# Patient Record
Sex: Male | Born: 1990 | Race: Black or African American | Hispanic: No | Marital: Single | Smoking: Current every day smoker
Health system: Southern US, Community
[De-identification: ages and names within clinical notes are randomized; demographics above are authoritative.]

## PROBLEM LIST (undated history)

## (undated) DIAGNOSIS — C9210 Chronic myeloid leukemia, BCR/ABL-positive, not having achieved remission: Secondary | ICD-10-CM

## (undated) DIAGNOSIS — F419 Anxiety disorder, unspecified: Secondary | ICD-10-CM

---

## 2009-08-22 ENCOUNTER — Emergency Department: Payer: Self-pay | Admitting: Emergency Medicine

## 2020-05-08 ENCOUNTER — Emergency Department
Admission: EM | Admit: 2020-05-08 | Discharge: 2020-05-08 | Disposition: A | Discharge status: Home / Self Care | Payer: No Typology Code available for payment source | Attending: Emergency Medicine | Admitting: Emergency Medicine

## 2020-05-08 ENCOUNTER — Emergency Department: Payer: No Typology Code available for payment source

## 2020-05-08 ENCOUNTER — Other Ambulatory Visit: Payer: Self-pay

## 2020-05-08 DIAGNOSIS — R079 Chest pain, unspecified: Secondary | ICD-10-CM

## 2020-05-08 DIAGNOSIS — R0789 Other chest pain: Secondary | ICD-10-CM | POA: Diagnosis present

## 2020-05-08 DIAGNOSIS — F410 Panic disorder [episodic paroxysmal anxiety] without agoraphobia: Secondary | ICD-10-CM | POA: Diagnosis not present

## 2020-05-08 DIAGNOSIS — R064 Hyperventilation: Secondary | ICD-10-CM

## 2020-05-08 DIAGNOSIS — F17200 Nicotine dependence, unspecified, uncomplicated: Secondary | ICD-10-CM | POA: Diagnosis not present

## 2020-05-08 HISTORY — DX: Chronic myeloid leukemia, BCR/ABL-positive, not having achieved remission: C92.10

## 2020-05-08 HISTORY — DX: Anxiety disorder, unspecified: F41.9

## 2020-05-08 LAB — BASIC METABOLIC PANEL
Anion gap: 10 (ref 5–15)
BUN: 15 mg/dL (ref 6–20)
CO2: 27 mmol/L (ref 22–32)
Calcium: 9 mg/dL (ref 8.9–10.3)
Chloride: 103 mmol/L (ref 98–111)
Creatinine, Ser: 1 mg/dL (ref 0.61–1.24)
GFR, Estimated: >60 mL/min (ref >60)
Glucose, Bld: 95 mg/dL (ref 70–99)
Potassium: 3.4 mmol/L — ABNORMAL LOW (ref 3.5–5.1)
Sodium: 140 mmol/L (ref 135–145)

## 2020-05-08 LAB — CBC
HCT: 41.2 % (ref 39.0–52.0)
Hemoglobin: 14.3 g/dL (ref 13.0–17.0)
MCH: 31 pg (ref 26.0–34.0)
MCHC: 34.7 g/dL (ref 30.0–36.0)
MCV: 89.2 fL (ref 80.0–100.0)
Platelets: 255 10*3/uL (ref 150–400)
RBC: 4.62 MIL/uL (ref 4.22–5.81)
RDW: 12.4 % (ref 11.5–15.5)
WBC: 9.3 10*3/uL (ref 4.0–10.5)
nRBC: 0 % (ref 0.0–0.2)

## 2020-05-08 LAB — TROPONIN I (HIGH SENSITIVITY): Troponin I (High Sensitivity): 4 ng/L (ref <18)

## 2020-05-08 NOTE — Discharge Instructions (Addendum)
You have been seen in the Emergency Department (ED) today for chest pain and shortness of breath.  As we have discussed today's test results are normal, and we feel it is likely that panic attacks may be causing your symptoms.  Please follow up with the recommended doctor as instructed above in these documents regarding today's emergent visit and your recent symptoms to discuss further management.  Continue to take your regular medications.   Return to the Emergency Department (ED) if you experience any further chest pain/pressure/tightness, difficulty breathing, or sudden sweating, or other symptoms that concern you.

## 2020-05-08 NOTE — ED Triage Notes (Signed)
Patient c/o chest pain. Reports tingling in bilateral hands. Patient reports near syncope. Patient reports hx of anxiety and chronic myeloid leukemia.

## 2020-05-08 NOTE — ED Notes (Signed)
Pt states that his father died yesterday and that he's not sure if his chest pain is related to anxiety or something else. Pt denies fevers or back pain.

## 2020-05-08 NOTE — ED Notes (Signed)
Pt transported to xray 

## 2020-05-08 NOTE — ED Provider Notes (Addendum)
[facility] Emergency Department Provider Note  ____________________________________________   First MD Initiated Contact with Patient 05/08/20 [phone]     (approximate)  I have reviewed the triage vital signs and the nursing notes.   HISTORY  Chief Complaint Chest Pain    HPI Ian Wilkerson is a 29 y.o. male with medical history as listed below who presents for evaluation of acute onset chest heaviness as well as some sharp pain, shortness of breath and rapid breathing, and tingling in both of his hands and his fingers.  He is visiting from [place] to attend the funeral of his father.  He suffers from anxiety at baseline.  He was driving tonight to get some food and said he was not thinking about anything in particular but then felt acute onset of the symptoms which became severe.  He had no weakness in any of his extremities, just the tingling in both of his hands as he was breathing rapidly.  He drove directly to the emergency department and started feeling better after he checked and.  I saw him after he had been waiting about 2 hours and he has been completely asymptomatic for that time.  He said that it feels like he probably had a panic attack.  He has no history of blood clots in his legs or his lungs even though he did drive from [place] to [place].  His CML is well managed by Dr. In Monia Sabal with a daily medication and he has had no issues recently.  He denies fever, sore throat, nausea, vomiting, abdominal pain, and dysuria.  Nothing in particular helps his symptoms feel better or worse but they have resolved.         Past Medical History:  Diagnosis Date  . Anxiety   . 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   History reviewed. No pertinent surgical history.  Prior to Admission medications   Not on File    Allergies Patient has no known allergies.  No family history  on file.  Social History Social History   Tobacco Use  . Smoking status: Current Some Day Smoker  . Smokeless tobacco: Never Used  Substance Use Topics  . Alcohol use: Yes  . Drug use: Not on file    Review of Systems Constitutional: No fever/chills Eyes: No visual changes. ENT: No sore throat. Cardiovascular: +chest pain. Respiratory: +shortness of breath. Gastrointestinal: No abdominal pain.  No nausea, no vomiting.  No diarrhea.  No constipation. Genitourinary: Negative for dysuria. Musculoskeletal: Negative for neck pain.  Negative for back pain. Integumentary: Negative for rash. Neurological: Tingling in hands and fingers, now resolved.  No focal weakness.   ____________________________________________   PHYSICAL EXAM:  VITAL SIGNS: ED Triage Vitals  Enc Vitals Group     BP 05/08/20 0507 126/71     Pulse Rate 05/08/20 0507 84     Resp 05/08/20 0507 18     Temp 05/08/20 0507 98.8 F (37.1 C)     Temp Source 05/08/20 0507 Oral     SpO2 05/08/20 0507 97 %     Weight 05/08/20 0508 111.1 kg (245 lb)     Height 05/08/20 0508 1.778 m (5\' 10" )     Head Circumference --      Peak Flow --      Pain Score 05/08/20 0507 0     Pain Loc --  Pain Edu? --      Excl. in [place]? --     Constitutional: Alert and oriented.  Eyes: Conjunctivae are normal.  Head: Atraumatic. Nose: No congestion/rhinnorhea. Mouth/Throat: Patient is wearing a mask. Neck: No stridor.  No meningeal signs.   Cardiovascular: Normal rate, regular rhythm. Good peripheral circulation. Grossly normal heart sounds. Respiratory: Normal respiratory effort.  No retractions. Gastrointestinal: Soft and nontender. No distention.  Musculoskeletal: No lower extremity tenderness nor edema. No gross deformities of extremities. Neurologic:  Normal speech and language. No gross focal neurologic deficits are appreciated.  Skin:  Skin is warm, dry and intact. Psychiatric: Mood and affect are normal. Speech and  behavior are normal.  ____________________________________________   LABS (all labs ordered are listed, but only abnormal results are displayed)  Labs Reviewed  BASIC METABOLIC PANEL - Abnormal; Notable for the following components:      Result Value   Potassium 3.4 (*)    All other components within normal limits  CBC  TROPONIN I (HIGH SENSITIVITY)   ____________________________________________  EKG  ED ECG REPORT I, Hinda Kehr, the attending physician, personally viewed and interpreted this ECG.  Date: 05/08/2020 EKG Time: 4:56 AM Rate: 76 Rhythm: normal sinus rhythm QRS Axis: normal Intervals: normal ST/T Wave abnormalities: normal Narrative Interpretation: no evidence of acute ischemia  ____________________________________________  RADIOLOGY I, Hinda Kehr, personally viewed and evaluated these images (plain radiographs) as part of my medical decision making, as well as reviewing the written report by the radiologist.  ED MD interpretation: No acute abnormalities on chest x-ray  Official radiology report(s): DG Chest 2 View  Result Date: 05/08/2020 CLINICAL DATA:  Chest pain EXAM: CHEST - 2 VIEW COMPARISON:  None. FINDINGS: Normal heart size and mediastinal contours. No acute infiltrate or edema. No effusion or pneumothorax. No acute osseous findings. IMPRESSION: Negative chest. Electronically Signed   By: Monte Fantasia M.D.   On: 05/08/2020 05:44    ____________________________________________   PROCEDURES   Procedure(s) performed (including Critical Care):  Procedures   ____________________________________________   INITIAL IMPRESSION / MDM / [place] / ED COURSE  As part of my medical decision making, I reviewed the following data within the [place] notes reviewed and incorporated, Labs reviewed , EKG interpreted , Old chart reviewed, Radiograph reviewed  and Notes from prior ED visits   Differential  diagnosis includes, but is not limited to, panic attack/anxiety, PE, pneumonia, cardiac arrhythmia, less likely ACS.  The patient is well-appearing in no distress with stable vital signs, no tachycardia, no tachypnea, and no hypoxemia.  EKG is reassuring with no evidence of acute abnormality.  I personally reviewed the patient's imaging and agree with the radiologist's interpretation that there is no evidence of any acute abnormality on the chest x-ray.  BMP, CBC, and troponin are all normal.  Although theoretically the patient's history of CML could lead to an increased risk of PE and he has been driving from [place], he has no leg pain or swelling and his vital signs are all stable as previously described.  His history is strongly consistent with a panic attack given the acute onset in the setting of the recent loss of his father and a baseline anxiety disorder.  He started to hyperventilate which made his hands tingle and once he got to the emergency department where he felt safe his symptoms completely resolved and he has been asymptomatic since then.  Of note, he is very low risk for ACS based  on HEAR score and there is no indication for additional troponins.  We talked about all this and he is very comfortable with this diagnosis and the plan for discharge.  He will follow-up as an outpatient once he gets home and I gave my usual and customary management recommendations and return precautions.           ____________________________________________  FINAL CLINICAL IMPRESSION(S) / ED DIAGNOSES  Final diagnoses:  Panic attack  Chest pain, unspecified type  Hyperventilation     MEDICATIONS GIVEN DURING THIS VISIT:  Medications - No data to display   ED Discharge Orders    None      *Please note:  Ian Wilkerson was evaluated in Emergency Department on 05/08/2020 for the symptoms described in the history of present illness. He was evaluated in the context of the global COVID-19  pandemic, which necessitated consideration that the patient might be at risk for infection with the SARS-CoV-2 virus that causes COVID-19. Institutional protocols and algorithms that pertain to the evaluation of patients at risk for COVID-19 are in a state of rapid change based on information released by regulatory bodies including the CDC and federal and state organizations. These policies and algorithms were followed during the patient's care in the ED.  Some ED evaluations and interventions may be delayed as a result of limited staffing during and after the pandemic.*  Note:  This document was prepared using Dragon voice recognition software and may include unintentional dictation errors.   Hinda Kehr, MD 05/08/20 2641    Hinda Kehr, MD 05/08/20 [phone]

## 2021-09-27 ENCOUNTER — Other Ambulatory Visit: Payer: Self-pay

## 2021-09-27 ENCOUNTER — Emergency Department
Admission: EM | Admit: 2021-09-27 | Discharge: 2021-09-27 | Disposition: A | Discharge status: Home / Self Care | Payer: No Typology Code available for payment source | Attending: Emergency Medicine | Admitting: Emergency Medicine

## 2021-09-27 ENCOUNTER — Emergency Department: Payer: No Typology Code available for payment source

## 2021-09-27 DIAGNOSIS — R112 Nausea with vomiting, unspecified: Secondary | ICD-10-CM | POA: Insufficient documentation

## 2021-09-27 DIAGNOSIS — R1013 Epigastric pain: Secondary | ICD-10-CM | POA: Insufficient documentation

## 2021-09-27 DIAGNOSIS — R1084 Generalized abdominal pain: Secondary | ICD-10-CM | POA: Diagnosis not present

## 2021-09-27 DIAGNOSIS — R1111 Vomiting without nausea: Secondary | ICD-10-CM

## 2021-09-27 LAB — LIPASE, BLOOD: Lipase: 35 U/L (ref 11–51)

## 2021-09-27 LAB — CBC
HCT: 41.8 % (ref 39.0–52.0)
Hemoglobin: 14.1 g/dL (ref 13.0–17.0)
MCH: 30 pg (ref 26.0–34.0)
MCHC: 33.7 g/dL (ref 30.0–36.0)
MCV: 88.9 fL (ref 80.0–100.0)
Platelets: 306 10*3/uL (ref 150–400)
RBC: 4.7 MIL/uL (ref 4.22–5.81)
RDW: 12.3 % (ref 11.5–15.5)
WBC: 9.9 10*3/uL (ref 4.0–10.5)
nRBC: 0 % (ref 0.0–0.2)

## 2021-09-27 LAB — URINALYSIS, ROUTINE W REFLEX MICROSCOPIC
Bilirubin Urine: NEGATIVE
Glucose, UA: NEGATIVE mg/dL
Hgb urine dipstick: NEGATIVE
Ketones, ur: NEGATIVE mg/dL
Leukocytes,Ua: NEGATIVE
Nitrite: NEGATIVE
Protein, ur: NEGATIVE mg/dL
Specific Gravity, Urine: 1.018 (ref 1.005–1.030)
pH: 6 (ref 5.0–8.0)

## 2021-09-27 LAB — TROPONIN I (HIGH SENSITIVITY): Troponin I (High Sensitivity): 4 ng/L (ref <18)

## 2021-09-27 LAB — BASIC METABOLIC PANEL
Anion gap: 7 (ref 5–15)
BUN: 11 mg/dL (ref 6–20)
CO2: 24 mmol/L (ref 22–32)
Calcium: 9.1 mg/dL (ref 8.9–10.3)
Chloride: 106 mmol/L (ref 98–111)
Creatinine, Ser: 0.82 mg/dL (ref 0.61–1.24)
GFR, Estimated: >60 mL/min (ref >60)
Glucose, Bld: 110 mg/dL — ABNORMAL HIGH (ref 70–99)
Potassium: 3.7 mmol/L (ref 3.5–5.1)
Sodium: 137 mmol/L (ref 135–145)

## 2021-09-27 MED ORDER — ONDANSETRON HCL 4 MG PO TABS: 4.0000 mg | ORAL_TABLET | Freq: Every day | ORAL | 0 refills | Status: AC | PRN

## 2021-09-27 MED ORDER — ACETAMINOPHEN 500 MG PO TABS
1000.0000 mg | ORAL_TABLET | Freq: Once | ORAL | Status: AC
  Administered 2021-09-27: 1000 mg via ORAL
  Filled 2021-09-27: qty 2

## 2021-09-27 MED ORDER — ONDANSETRON 4 MG PO TBDP
4.0000 mg | ORAL_TABLET | Freq: Once | ORAL | Status: AC
  Administered 2021-09-27: 4 mg via ORAL
  Filled 2021-09-27: qty 1

## 2021-09-27 MED ORDER — ALUM & MAG HYDROXIDE-SIMETH 200-200-20 MG/5ML PO SUSP
30.0000 mL | Freq: Once | ORAL | Status: AC
  Administered 2021-09-27: 30 mL via ORAL
  Filled 2021-09-27: qty 30

## 2021-09-27 NOTE — ED Triage Notes (Signed)
Pt here with generalized chest pressure and emesis. Pt state chest pressure comes and goes and rates pain as a 6/10. Pt states 2 episodes of vomiting. Pt stable in triage. ?

## 2021-09-27 NOTE — ED Provider Notes (Signed)
? ?[facility] ?Provider Note ? ? ? Event Date/Time  ? First MD Initiated Contact with Patient 09/27/21 1119   ?  (approximate) ? ? ?History  ? ?Chest Pain and Nausea ? ? ?HPI ? ?Ian Wilkerson is a 31 y.o. male with past medical history of CML on Tasign[place], anxiety presents with epigastric pain.  Patient woke up in the middle of the night felt a burning sensation in his chest and throat similar to typical GERD for him.  Had several episodes of emesis.  Last episode had several streaks of what looked like blood to him.  Has had similar in the past.  Denies black stool or blood in his stool.  Does have some ongoing abdominal discomfort that is generalized.  Denies ongoing nausea vomiting or chest pain.  Has not had diarrhea but feels like he needs to have a bowel movement.  Patient has history of GERD but does not take any antacids because been told that it interacts with the to [employer] which is his CML medication. ?  ? ?Past Medical History:  ?Diagnosis Date  ? Anxiety   ? 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 ? ?There are no problems to display for this patient. ? ? ? ?Physical Exam  ?Triage Vital Signs: ?ED Triage Vitals  ?Enc Vitals Group  ?   BP 09/27/21 0914 132/86  ?   Pulse Rate 09/27/21 0914 91  ?   Resp 09/27/21 0914 18  ?   Temp 09/27/21 0914 98.4 ?F (36.9 ?C)  ?   Temp Source 09/27/21 0914 Oral  ?   SpO2 09/27/21 0914 100 %  ?   Weight 09/27/21 0918 245 lb (111.1 kg)  ?   Height 09/27/21 0918 '5\' 10"'$  (1.778 m)  ?   Head Circumference --   ?   Peak Flow --   ?   Pain Score 09/27/21 0918 6  ?   Pain Loc --   ?   Pain Edu? --   ?   Excl. in [place]? --   ? ? ?Most recent vital signs: ?Vitals:  ? 09/27/21 0914  ?BP: 132/86  ?Pulse: 91  ?Resp: 18  ?Temp: 98.4 ?F (36.9 ?C)  ?SpO2: 100%  ? ? ? ?General: Awake, no distress.  ?CV:  Good peripheral perfusion.  ?Resp:  Normal effort.  ?Abd:  No distention.  Soft and nontender throughout ?Neuro:             Awake, Alert, Oriented x  3  ?Other:   ? ? ?ED Results / Procedures / Treatments  ?Labs ?(all labs ordered are listed, but only abnormal results are displayed) ?Labs Reviewed  ?BASIC METABOLIC PANEL - Abnormal; Notable for the following components:  ?    Result Value  ? Glucose, Bld 110 (*)   ? All other components within normal limits  ?URINALYSIS, ROUTINE W REFLEX MICROSCOPIC - Abnormal; Notable for the following components:  ? Color, Urine YELLOW (*)   ? APPearance HAZY (*)   ? All other components within normal limits  ?CBC  ?LIPASE, BLOOD  ?TROPONIN I (HIGH SENSITIVITY)  ?TROPONIN I (HIGH SENSITIVITY)  ? ? ? ?EKG ? ?EKG interpreted by myself, normal sinus rhythm, normal axis normal intervals nonspecific T wave inversions in the inferior leads, similar to prior EKG ? ? ?RADIOLOGY ?I reviewed the CXR which does not show any acute cardiopulmonary process; agree with radiology report  ? ? ? ?PROCEDURES: ? ?Critical Care  performed: No ? ?Procedures ? ? ? ?MEDICATIONS ORDERED IN ED: ?Medications  ?alum & mag hydroxide-simeth (MAALOX/MYLANTA) 200-200-20 MG/5ML suspension 30 mL (has no administration in time range)  ?acetaminophen (TYLENOL) tablet 1,000 mg (has no administration in time range)  ?ondansetron (ZOFRAN-ODT) disintegrating tablet 4 mg (has no administration in time range)  ? ? ? ?IMPRESSION / MDM / ASSESSMENT AND PLAN / ED COURSE  ?I reviewed the triage vital signs and the nursing notes. ?             ?               ? ?Differential diagnosis includes, but is not limited to, gastritis, viral gastroenteritis, GERD, less likely peptic ulcer disease, esophageal varices ? ?Patient is a 31 year old male with history of CML on biologic therapy who presents with vomiting and acid burning sensation in his chest and throat.  This woke him up from sleep.  Had several episodes of emesis and at the end there was several streaks of what looked like blood to him which she has had in the past.  Does endorse some ongoing generalized abdominal pain  but has not had any additional episodes of vomiting denies any ongoing chest pain has not had diarrhea.  No history of black stools or blood in his stool.  Patient does not take any antacids because this interacts with his cancer medication.  On exam appears quite well abdomen is soft and nontender.  His labs are all reassuring normal lipase and LFTs and his hemoglobin is normal with no leukocytosis.  I suspect that this is either gastritis/viral gastroenteritis versus GERD.  Episode of hematemesis sounds to be quite minor and at the end of his episodes of vomiting without any melena or bright red blood per history.  With normal hemoglobin I think that he is appropriate for outpatient management.  We will treat supportively with GI cocktail Tylenol and Zofran here in the ED.  Did discuss return precautions for any blood in his stool or ongoing hematemesis.  EKG reviewed by myself does show T wave inversions in the inferior leads however this looks similar to prior EKG he is not having any ongoing chest pain and troponin is negative. ? ?  ? ? ?FINAL CLINICAL IMPRESSION(S) / ED DIAGNOSES  ? ?Final diagnoses:  ?Vomiting without nausea, unspecified vomiting type  ? ? ? ?Rx / DC Orders  ? ?ED Discharge Orders   ? ? None  ? ?  ? ? ? ?Note:  This document was prepared using Dragon voice recognition software and may include unintentional dictation errors. ?  ?Rada Hay, MD ?09/27/21 1151 ? ?

## 2021-09-27 NOTE — Discharge Instructions (Addendum)
If you have any blood in your stool black stool, you are unable to eat or drink or you develop worsening vomiting with blood in your vomit please return to the emergency department. ?

## 2022-04-02 ENCOUNTER — Other Ambulatory Visit: Payer: Self-pay | Admitting: Emergency Medicine

## 2022-04-02 ENCOUNTER — Emergency Department
Admission: EM | Admit: 2022-04-02 | Discharge: 2022-04-02 | Discharge status: Against Medical Advice | Payer: No Typology Code available for payment source | Attending: Emergency Medicine | Admitting: Emergency Medicine

## 2022-04-02 ENCOUNTER — Emergency Department: Payer: No Typology Code available for payment source

## 2022-04-02 DIAGNOSIS — R410 Disorientation, unspecified: Secondary | ICD-10-CM | POA: Insufficient documentation

## 2022-04-02 DIAGNOSIS — O0489 (Induced) termination of pregnancy with other complications: Secondary | ICD-10-CM

## 2022-04-02 DIAGNOSIS — Z5321 Procedure and treatment not carried out due to patient leaving prior to being seen by health care provider: Secondary | ICD-10-CM | POA: Diagnosis not present

## 2022-04-02 LAB — BASIC METABOLIC PANEL
Anion gap: 6 (ref 5–15)
BUN: 12 mg/dL (ref 6–20)
CO2: 28 mmol/L (ref 22–32)
Calcium: 9.1 mg/dL (ref 8.9–10.3)
Chloride: 105 mmol/L (ref 98–111)
Creatinine, Ser: 0.88 mg/dL (ref 0.61–1.24)
GFR, Estimated: >60 mL/min (ref >60)
Glucose, Bld: 106 mg/dL — ABNORMAL HIGH (ref 70–99)
Potassium: 3.7 mmol/L (ref 3.5–5.1)
Sodium: 139 mmol/L (ref 135–145)

## 2022-04-02 LAB — CBC WITH DIFFERENTIAL/PLATELET
Abs Immature Granulocytes: 0.04 10*3/uL (ref 0.00–0.07)
Basophils Absolute: 0.1 10*3/uL (ref 0.0–0.1)
Basophils Relative: 1 %
Eosinophils Absolute: 0.1 10*3/uL (ref 0.0–0.5)
Eosinophils Relative: 1 %
HCT: 42.9 % (ref 39.0–52.0)
Hemoglobin: 14.7 g/dL (ref 13.0–17.0)
Immature Granulocytes: 0 %
Lymphocytes Relative: 41 %
Lymphs Abs: 4 10*3/uL (ref 0.7–4.0)
MCH: 30.6 pg (ref 26.0–34.0)
MCHC: 34.3 g/dL (ref 30.0–36.0)
MCV: 89.2 fL (ref 80.0–100.0)
Monocytes Absolute: 0.9 10*3/uL (ref 0.1–1.0)
Monocytes Relative: 9 %
Neutro Abs: 4.6 10*3/uL (ref 1.7–7.7)
Neutrophils Relative %: 48 %
Platelets: 323 10*3/uL (ref 150–400)
RBC: 4.81 MIL/uL (ref 4.22–5.81)
RDW: 12.7 % (ref 11.5–15.5)
WBC: 9.7 10*3/uL (ref 4.0–10.5)
nRBC: 0 % (ref 0.0–0.2)

## 2022-04-02 MED ORDER — IOHEXOL 350 MG/ML SOLN: 75.0000 mL | Freq: Once | INTRAVENOUS | Status: DC | PRN

## 2022-04-02 MED ORDER — PROCHLORPERAZINE EDISYLATE 10 MG/2ML IJ SOLN
10.0000 mg | Freq: Once | INTRAMUSCULAR | Status: AC
  Administered 2022-04-02: 10 mg via INTRAVENOUS
  Filled 2022-04-02: qty 2

## 2022-04-02 MED ORDER — SODIUM CHLORIDE 0.9 % IV BOLUS (SEPSIS)
1000.0000 mL | Freq: Once | INTRAVENOUS | Status: AC
  Administered 2022-04-02: 1000 mL via INTRAVENOUS

## 2022-04-02 NOTE — ED Triage Notes (Signed)
Reports L sided pressure to head with radiation behind L eye. Denies hx of h/a or migraine. Denies vision change or parasthesia. Denies fall or injury. Pt alert and oriented following commands. Breathing unlabored speaking in full sentences.

## 2022-04-02 NOTE — ED Notes (Signed)
RN went to take pt to room and pt states he's ready to go home. RN informed pt he has a room and is next for CT and pt continues to express desire to go home. RN offered to have MD speak with pt and pt declined. Pt states he will return should h/a return but that h/a has improved since IVF and medicine. Pt ambulatory from department with independent and steady gait.

## 2022-04-12 ENCOUNTER — Emergency Department: Payer: No Typology Code available for payment source

## 2022-04-12 ENCOUNTER — Emergency Department
Admission: EM | Admit: 2022-04-12 | Discharge: 2022-04-12 | Disposition: A | Discharge status: Home / Self Care | Payer: No Typology Code available for payment source | Attending: Emergency Medicine | Admitting: Emergency Medicine

## 2022-04-12 ENCOUNTER — Encounter: Payer: Self-pay | Admitting: Emergency Medicine

## 2022-04-12 DIAGNOSIS — I10 Essential (primary) hypertension: Secondary | ICD-10-CM | POA: Diagnosis not present

## 2022-04-12 DIAGNOSIS — R519 Headache, unspecified: Secondary | ICD-10-CM | POA: Diagnosis present

## 2022-04-12 NOTE — ED Provider Notes (Signed)
   [facility] Provider Note    Event Date/Time   First MD Initiated Contact with Patient 04/12/22 [phone]     (approximate)  History   Chief Complaint: Migraine  HPI  Ian Wilkerson is a 31 y.o. male with a past medical history of anxiety, CML, presents to the emergency department for headaches.  According to the patient over the past week or so he has been experiencing right-sided headaches (contrary to triage note).  Patient states they have been intermittent occurring most days but not every day, mostly behind the right eye.  No visual changes.  Patient states he was on the Internet looking things up and thought he should come get evaluated.  States his headache is currently a 2/10.  Physical Exam   Triage Vital Signs: ED Triage Vitals  Enc Vitals Group     BP 04/12/22 0157 (!) 146/97     Pulse Rate 04/12/22 0157 89     Resp 04/12/22 0157 20     Temp 04/12/22 0157 97.9 F (36.6 C)     Temp Source 04/12/22 0157 Oral     SpO2 04/12/22 0157 99 %     Weight 04/12/22 0158 250 lb (113.4 kg)     Height 04/12/22 0158 '5\' 10"'$  (1.778 m)     Head Circumference --      Peak Flow --      Pain Score 04/12/22 0156 2     Pain Loc --      Pain Edu? --      Excl. in [place]? --     Most recent vital signs: Vitals:   04/12/22 0157  BP: (!) 146/97  Pulse: 89  Resp: 20  Temp: 97.9 F (36.6 C)  SpO2: 99%    General: Awake, no distress.  CV:  Good peripheral perfusion.  Regular rate and rhythm  Resp:  Normal effort.  Equal breath sounds bilaterally.  Abd:  No distention.  Soft, nontender.  No rebound or guarding.   ED Results / Procedures / Treatments   RADIOLOGY  I have reviewed and interpreted CT head images.  No significant abnormality on my evaluation. Radiology has read the CT scan is negative   MEDICATIONS ORDERED IN ED: Medications - No data to display   IMPRESSION / MDM / [place] / ED COURSE  I reviewed the triage vital signs and the  nursing notes.  Patient's presentation is most consistent with acute presentation with potential threat to life or bodily function.  Patient presents emergency department for right-sided headache intermittent x1 week.  Overall the patient appears well, no distress.  States the headache is very minimal at this time he just wanted to come get evaluated to make sure were not missing something more important.  Overall the patient appears well reassuring physical exam, reassuring vitals signs mild hypertension.  We will obtain CT imaging of the head as a precaution.  Offered to treat the headache, patient states it is very minimal at this time he does not want any medication.  CT scan is negative.  Given the patient's reassuring work-up we will discharge patient with neurology follow-up if he continues to have headaches.  Patient agreeable to plan  FINAL CLINICAL IMPRESSION(S) / ED DIAGNOSES   Headache    Note:  This document was prepared using Dragon voice recognition software and may include unintentional dictation errors.   Harvest Dark, MD 04/12/22 [phone]

## 2022-04-12 NOTE — ED Triage Notes (Signed)
Pt presents via POV with complaints of L sided migraine that started yesterday afternoon. Pt was seen here last week for the same but notes things improved until today. Pt states that he had Compazine last time and it caused him feel extremely bad. Denies parasthesia, myalgia, vision changes, CP or SOB.

## 2022-04-12 NOTE — ED Notes (Signed)
Patient transported to CT 

## 2022-04-12 NOTE — Discharge Instructions (Addendum)
Please call the number for neurology to arrange a follow-up appointment regarding your headaches.  Return to the emergency department for any significant/severe headache, any weakness or numbness of any arm or leg confusion or difficulty speaking.

## 2022-04-18 ENCOUNTER — Encounter: Payer: Self-pay | Admitting: Emergency Medicine

## 2022-04-18 ENCOUNTER — Emergency Department
Admission: EM | Admit: 2022-04-18 | Discharge: 2022-04-18 | Disposition: A | Discharge status: Home / Self Care | Payer: No Typology Code available for payment source | Attending: Emergency Medicine | Admitting: Emergency Medicine

## 2022-04-18 ENCOUNTER — Other Ambulatory Visit: Payer: Self-pay

## 2022-04-18 ENCOUNTER — Emergency Department: Payer: No Typology Code available for payment source

## 2022-04-18 DIAGNOSIS — L03213 Periorbital cellulitis: Secondary | ICD-10-CM | POA: Insufficient documentation

## 2022-04-18 DIAGNOSIS — H5789 Other specified disorders of eye and adnexa: Secondary | ICD-10-CM | POA: Diagnosis present

## 2022-04-18 LAB — CBC WITH DIFFERENTIAL/PLATELET
Abs Immature Granulocytes: 0.03 10*3/uL (ref 0.00–0.07)
Basophils Absolute: 0 10*3/uL (ref 0.0–0.1)
Basophils Relative: 0 %
Eosinophils Absolute: 0.1 10*3/uL (ref 0.0–0.5)
Eosinophils Relative: 0 %
HCT: 42.2 % (ref 39.0–52.0)
Hemoglobin: 14.1 g/dL (ref 13.0–17.0)
Immature Granulocytes: 0 %
Lymphocytes Relative: 28 %
Lymphs Abs: 3.5 10*3/uL (ref 0.7–4.0)
MCH: 29.6 pg (ref 26.0–34.0)
MCHC: 33.4 g/dL (ref 30.0–36.0)
MCV: 88.7 fL (ref 80.0–100.0)
Monocytes Absolute: 1 10*3/uL (ref 0.1–1.0)
Monocytes Relative: 8 %
Neutro Abs: 8.1 10*3/uL — ABNORMAL HIGH (ref 1.7–7.7)
Neutrophils Relative %: 64 %
Platelets: 303 10*3/uL (ref 150–400)
RBC: 4.76 MIL/uL (ref 4.22–5.81)
RDW: 12.6 % (ref 11.5–15.5)
WBC: 12.8 10*3/uL — ABNORMAL HIGH (ref 4.0–10.5)
nRBC: 0 % (ref 0.0–0.2)

## 2022-04-18 LAB — BASIC METABOLIC PANEL
Anion gap: 8 (ref 5–15)
BUN: 11 mg/dL (ref 6–20)
CO2: 26 mmol/L (ref 22–32)
Calcium: 9.6 mg/dL (ref 8.9–10.3)
Chloride: 104 mmol/L (ref 98–111)
Creatinine, Ser: 0.99 mg/dL (ref 0.61–1.24)
GFR, Estimated: >60 mL/min (ref >60)
Glucose, Bld: 85 mg/dL (ref 70–99)
Potassium: 3.9 mmol/L (ref 3.5–5.1)
Sodium: 138 mmol/L (ref 135–145)

## 2022-04-18 MED ORDER — CEFDINIR 300 MG PO CAPS: 300.0000 mg | ORAL_CAPSULE | Freq: Two times a day (BID) | ORAL | 0 refills | Status: AC

## 2022-04-18 MED ORDER — SULFAMETHOXAZOLE-TRIMETHOPRIM 800-160 MG PO TABS: 1.0000 | ORAL_TABLET | Freq: Two times a day (BID) | ORAL | 0 refills | Status: AC

## 2022-04-18 MED ORDER — SULFAMETHOXAZOLE-TRIMETHOPRIM 800-160 MG PO TABS: 1.0000 | ORAL_TABLET | Freq: Two times a day (BID) | ORAL | 0 refills | Status: DC

## 2022-04-18 MED ORDER — IOHEXOL 300 MG/ML  SOLN
75.0000 mL | Freq: Once | INTRAMUSCULAR | Status: AC | PRN
  Administered 2022-04-18: 75 mL via INTRAVENOUS

## 2022-04-18 MED ORDER — CEFDINIR 300 MG PO CAPS: 300.0000 mg | ORAL_CAPSULE | Freq: Two times a day (BID) | ORAL | 0 refills | Status: DC

## 2022-04-18 NOTE — ED Provider Notes (Signed)
[facility] Provider Note    None    (approximate)   History   Eye Pain   HPI  Ian Wilkerson is a 31 y.o. male  with history of CML on chemo who comes in with R eye swelling. Noticed a pimple he tried to pop it yesterday and then swelling. No fevers. Seeing okay no blurred vision-. Just swelling noticed.  Pt gets his care at the [place]. Denies any vision changes.    Physical Exam   Triage Vital Signs: ED Triage Vitals  Enc Vitals Group     BP 04/18/22 0233 (!) 149/95     Pulse Rate 04/18/22 0233 78     Resp 04/18/22 0233 20     Temp 04/18/22 0233 98.9 F (37.2 C)     Temp Source 04/18/22 0233 Oral     SpO2 04/18/22 0233 98 %     Weight --      Height --      Head Circumference --      Peak Flow --      Pain Score 04/18/22 0233 6     Pain Loc --      Pain Edu? --      Excl. in [place]? --     Most recent vital signs: Vitals:   04/18/22 0233  BP: (!) 149/95  Pulse: 78  Resp: 20  Temp: 98.9 F (37.2 C)  SpO2: 98%     General: Awake, no distress.  CV:  Good peripheral perfusion.  Resp:  Normal effort.  Abd:  No distention.  Other:  Mild swelling around the R eye, able to open eye, conjuctia normal. Pupil reaction EOMI. No vision changes    ED Results / Procedures / Treatments   Labs (all labs ordered are listed, but only abnormal results are displayed) Labs Reviewed  CBC WITH DIFFERENTIAL/PLATELET - Abnormal; Notable for the following components:      Result Value   WBC 12.8 (*)    Neutro Abs 8.1 (*)    All other components within normal limits  BASIC METABOLIC PANEL     RADIOLOGY I have reviewed the CT personally and interpretted and no Abscess   IMPRESSION: Right side Preseptal Cellulitis with periorbital soft tissue swelling. No postseptal involvement, abscess, or other complicating features.   PROCEDURES:  Critical Care performed: No  Procedures   MEDICATIONS ORDERED IN ED: Medications  iohexol (OMNIPAQUE)  300 MG/ML solution 75 mL (75 mLs Intravenous Contrast Given 04/18/22 0357)     IMPRESSION / MDM / ASSESSMENT AND PLAN / ED COURSE  I reviewed the triage vital signs and the nursing notes.   Patient's presentation is most consistent with acute presentation with potential threat to life or bodily function.   Orbital cellulitis vs preseptal  CT ordered triage negative for orbital and pt has no signs of symptoms to suggest this. Pt placed on bactrim and cefdinir to try to get additional coverage. Recommend eye clinic follow up in 2 days and discussed return precautions to look out for orbital cellulitis. Discussed calling his oncology team to update them.  Considered admission but given afebrile and no orbital cellulitis okay to trial outpt antibiotics.   Bmp normal Cbc slihgtly elevated white count    Reviewed not on 3/3 from office visit     FINAL CLINICAL IMPRESSION(S) / ED DIAGNOSES   Final diagnoses:  Preseptal cellulitis of right eye     Rx / DC Orders   ED  Discharge Orders     None        Note:  This document was prepared using Dragon voice recognition software and may include unintentional dictation errors.   Vanessa [place], MD 04/18/22 [phone]

## 2022-04-18 NOTE — Discharge Instructions (Signed)
Call [facility] for follow up 2 days, return for eye pain, blurred vision or any other concerns

## 2022-04-18 NOTE — ED Triage Notes (Addendum)
Pt to ED via POV with c/o R eye swelling. Pt states had a pimple that he popped on Friday night, popped again on Saturday night. Pt states swelling to R eye started after that. Pt with noted significant swelling to R eye. Pt states swelling has worsened since he has been sitting in the lobby.   Pt currently taking Nilotinib (chemo) for CML.

## 2022-12-21 ENCOUNTER — Encounter: Payer: Self-pay | Admitting: *Deleted

## 2022-12-21 ENCOUNTER — Other Ambulatory Visit: Payer: Self-pay

## 2022-12-21 DIAGNOSIS — L600 Ingrowing nail: Secondary | ICD-10-CM | POA: Diagnosis not present

## 2022-12-21 DIAGNOSIS — Z856 Personal history of leukemia: Secondary | ICD-10-CM | POA: Diagnosis not present

## 2022-12-21 DIAGNOSIS — M79674 Pain in right toe(s): Secondary | ICD-10-CM | POA: Diagnosis present

## 2022-12-21 DIAGNOSIS — B49 Unspecified mycosis: Secondary | ICD-10-CM | POA: Diagnosis not present

## 2022-12-21 NOTE — ED Triage Notes (Signed)
Pt has right great toe pain for 3 weeks.  Pt reports toenail is ingrown and painful.  No known injury to toe/foot.   Pt alert.

## 2022-12-22 ENCOUNTER — Emergency Department
Admission: EM | Admit: 2022-12-22 | Discharge: 2022-12-22 | Disposition: A | Discharge status: Home / Self Care | Payer: Non-veteran care | Attending: Emergency Medicine | Admitting: Emergency Medicine

## 2022-12-22 DIAGNOSIS — L600 Ingrowing nail: Secondary | ICD-10-CM

## 2022-12-22 MED ORDER — DOXYCYCLINE HYCLATE 100 MG PO TABS: 100.0000 mg | ORAL_TABLET | Freq: Two times a day (BID) | ORAL | 0 refills | Status: DC

## 2022-12-22 NOTE — ED Provider Notes (Signed)
   [facility] Provider Note    Event Date/Time   First MD Initiated Contact with Patient 12/22/22 0113     (approximate)  History   Chief Complaint: Toe Pain  HPI  Ian Wilkerson is a 32 y.o. male with a past medical history of anxiety, CML, presents to the emergency department for right great toe pain.  According to the patient approximately 2 years ago he had an injury to the right toe in which the toenail ripped off.  He states he did not have a toenail for over a year but approximately 6 months ago the toenail began growing back.  It has grown back and a curved shape and states that the day he stubbed his toe ever since then has had pain to the area such as the ingrown nail.  Patient states he did not know what to do so he came to the emergency department for evaluation.  Physical Exam   Triage Vital Signs: ED Triage Vitals  Enc Vitals Group     BP 12/21/22 2334 (!) 143/82     Pulse Rate 12/21/22 2334 83     Resp 12/21/22 2334 17     Temp 12/21/22 2334 98.6 F (37 C)     Temp Source 12/21/22 2334 Oral     SpO2 12/21/22 2334 96 %     Weight 12/21/22 2326 260 lb (117.9 kg)     Height 12/21/22 2326 5\' 10"  (1.778 m)     Head Circumference --      Peak Flow --      Pain Score 12/21/22 2326 7     Pain Loc --      Pain Edu? --      Excl. in GC? --     Most recent vital signs: Vitals:   12/21/22 2334  BP: (!) 143/82  Pulse: 83  Resp: 17  Temp: 98.6 F (37 C)  SpO2: 96%    General: Awake, no distress.  CV:  Good peripheral perfusion.  Resp:  Normal effort.  Abd:  No distention.   Other:  Patient has mild erythema surrounding the nail likely ingrown nail but no sign of paronychia or felon.  Patient's toenail is very thickened and curved.   ED Results / Procedures / Treatments   MEDICATIONS ORDERED IN ED: Medications - No data to display   IMPRESSION / MDM / ASSESSMENT AND PLAN / ED COURSE  I reviewed the triage vital signs and the  nursing notes.  Patient's presentation is most consistent with acute, uncomplicated illness.  Patient presents emergency department for right great toe pain.  Patient appears to have an ingrown toenail with a very thickened and curved nail since injuring the nail 2 years ago.  Discussed with the patient he needs to follow-up with podiatry for ongoing management.  Will prescribe a short course of antibiotic given the ingrown toenail pain and redness although no sign of paronychia or felon.  I also discussed with the patient using over-the-counter antifungal to the nail as I suspect there is likely fungal infection as well given the thickened and opaque nail.  FINAL CLINICAL IMPRESSION(S) / ED DIAGNOSES   Ingrown toenail Fungal infection  Note:  This document was prepared using Dragon voice recognition software and may include unintentional dictation errors.   Minna Antis, MD 12/22/22 [phone]

## 2022-12-22 NOTE — Discharge Instructions (Signed)
These call the number provided to arrange a follow-up appointment with podiatry soon as possible.  Please take antibiotics as prescribed for the next 7 days.  As we discussed please use an over-the-counter antifungal nail lotion or nail polish available at any drugstore for the next 2 weeks.  Return to the emergency department for any worsening pain redness swelling or fever.

## 2023-02-11 NOTE — ED Provider Notes (Signed)
 CHIEF COMPLAINT Chief Complaint  Patient presents with  . Chest Pain  . Shortness of Breath  . Emesis    HPI Ian Wilkerson is a 32 y.o. male who presents with chest pain tonight that occurred while he was lying down asleep.  He is here for work from out of town.  He has a history of smoking cigars, remote history of leukemia but then remission.  He denies hypertension, hypercholesterolemia, diabetes.  States the pain was left substernal in nature, has resolved now.  He felt nauseated and vomited once when this occurred.  No shortness of breath.  He feels back to baseline now.  No calf pain or swelling.  Pain, when present, was not pleuritic in nature.  REVIEW OF SYSTEMS  CONSTITUTIONAL:  Denies fever, chills, or weakness ENT:  Denies sore throat or ear pain CARDIOVASCULAR:  Notes chest pain as above, no palpitations or swelling.  No orthopnea. RESPIRATORY:  Denies cough, congestion or shortness of breath. Denies wheezing. GI:  Denies abdominal pain, notes nausea and 1 episode of vomiting as above, no diarrhea; Denies dysuria. Denies black or bloody stools. ENDOCRINE:  No polyuria, polydipsia, blurry vision,  MUSCULOSKELETAL:  Denies back pain SKIN:  No rash HEME: The patient denies abnormal bruising or abnormal bleeding from any body orifice such as bleeding from nose or gums, blood in urine or stool, or melena, hemoptysis or hematemesis. NEUROLOGIC:  Denies headache, focal weakness or sensory changes PSYCH:  Denies depression, suicidal ideation or homicial ideation.  Denies hallucinations or delusions.    All systems negative except as documented above.   PAST MEDICAL HISTORY Past Medical History:  Diagnosis Date  . Leukemia in remission [facility])     ? FAMILY HISTORY No family history on file.  SOCIAL HISTORY Social History   Socioeconomic History  . Marital status: Single  Tobacco Use  . Smoking status: Every Day    Types: Cigars    Passive exposure: Never  .  Smokeless tobacco: Never  Vaping Use  . Vaping Use: Never used  Substance and Sexual Activity  . Alcohol use: Yes    Comment: socially  . Drug use: Never    SURGICAL HISTORY Past Surgical History:  Procedure Laterality Date  . ORTHOPEDIC SURGERY      CURRENT MEDICATIONS @COPMEDS @  ALLERGIES fs22 Allergies  Allergen Reactions  . Prochlorperazine  Palpitations    PHYSICAL EXAM VITAL SIGNS: Visit Vitals BP 135/75  Pulse 78  Temp 98.9 F (37.2 C) (Oral)  Resp 20  Ht 5' 10  Wt (!) 251 lb (113.9 kg)  SpO2 99%  BMI 36.01 kg/m  Smoking Status Every Day  BSA 2.37 m   Constitutional: Well developed, Well nourished, pleasant male HENT: Normocephalic, Atraumatic, Bilateral external ears normal, Oropharynx pink and moist, No exudates.  TMs clear bilaterally.    Eyes: PERRL, EOMI, Conjunctiva normal, No discharge.  Neck:  Supple, nontender, normal range of motion.   Cardiac: Regular rate and rhythm, no murmur Thorax & Lungs: Normal breath sounds, No respiratory distress, No wheezing, rales, or ronchi.  Chest wall nontender to palpation Skin: Warm, Dry, Pink. No erythema, No rash.  Abdomen: Soft, nontender, nondistended.  No rebound or guard. Extremities: Intact distal pulses, No edema, No tenderness.  Musculoskeletal: Good range of motion in all major joints. I palpated all the long bones and joints and found no areas of tenderness or swelling. Neurologic: Alert & oriented x 3, No gross motor or sensory deficits.   EKG Seen  by me showed sinus rhythm with nonspecific T-wave changes, no old EKGs for comparison  RADIOLOGY/PROCEDURES Plain chest radiograph seen by me was unremarkable.  Medications - No data to display  COURSE & MEDICAL DECISION MAKING The patient was thoroughly evaluated by me. Pertinent Labs & Imaging studies reviewed. (See chart for details) Lab work was reassuring, including troponin negative twice.  HEART score 2. He was pain-free throughout the ED  course, was asleep on reassessment.  This time he will be discharged, encouraged to follow up with his primary care provider at home, returning for concerning change.  He voiced understanding and agreement.    FINAL IMPRESSION 1.  Chest pain  2. 3.      Marolyn Bruckner, MD 02/11/23 [phone]

## 2023-04-27 NOTE — ED Provider Notes (Signed)
 HISTORY OF PRESENT ILLNESS  Ian Wilkerson, a 32 y.o. male presents to the ED with a Chief Complaint of Jaw Pain and Shortness of Breath   Subjective  Ian Wilkerson is a 32 y.o.male with a past medical history of CML and anxiety presents via private vehicle for evaluation of jaw pain. The patient reports onset of his symptoms beginning yesterday. He states that he noticed right jaw pain at that time with some associated radiation and swelling into his right neck. The patient describes his current pain as a soreness. He further notes that he has experienced some intermittent shortness of breath with his symptoms. The patient denies any sore throat, difficulty swallowing, neck stiffness, headache, cough, chest pain, hemoptysis, or leg swelling. He denies any recent falls or traumas. The patient states that he was ill 2 weeks ago, but states that his symptoms had resolved since that time. He denies any international travel in the past month. He acknowledges his diagnosis of CML, noting that he saw his oncologist last week with largely normal blood counts. The patient states that he has been in remission since 2021, noting that he visits his oncologist Q3 months for blood work. The patient denies any fevers, chills, congestion, cough, diaphoresis, chest pain, abdominal pain, nausea, vomiting, diarrhea, dysuria, back pain, or headache. He has no further complaints at this time.   PSHx: Left achilles repair. SHx: Daily cigar smoker. Reports social EtOH consumption. Denies other substance use.  PCP: No primary care provider on file.   History provided by:  The patient Language interpreter used: No   Jaw Pain Associated symptoms: neck pain   Associated symptoms: no congestion, no rhinorrhea, no vomiting and no wheezing   Shortness of Breath Associated symptoms: neck pain   Associated symptoms: no abdominal pain, no chest pain, no fever, no rash, no sore throat, no vomiting and no wheezing      PAST MEDICAL HISTORY REVIEWED   MEDICAL Patient  has a past medical history of Anxiety and Leukemia (HCC) (2019).   SURGICAL Patient  has a past surgical history that includes hx achilles tendon repair (Left, 2022).  FAMILY Patient's family history is not on file.  SOCIAL  reports that he has been smoking cigars. He has never used smokeless tobacco. He reports current alcohol use. He reports that he does not use drugs. PROBLEM LIST Patient does not have a problem list on file.  ALLERGIES Prochlorperazine   HOME MEDICATIONS  Patient's Home Meds  Medication Sig  . naproxen (NAPROSYN) 500 mg tablet Take 1 Tablet (500 mg) by mouth Every 12 hours as needed for Pain, Moderate.  . nilotinib (TASIGNA) 150 mg capsule TAKE TWO CAPSULES BY MOUTH TWICE A DAY (DO NOT EAT FOR AT LEAST 2 HOURS BEFORE OR 1 HOUR AFTER EACH DOSE) APPROVED    REVIEW OF SYSTEMS  Review of Systems  Constitutional:  Negative for chills and fever.       With the exception of those things covered in the HPI, all other systems were reviewed and are either negative or non-contributory  HENT:  Negative for congestion, rhinorrhea, sore throat and trouble swallowing.        Jaw pain  Eyes:  Negative for photophobia and redness.  Respiratory:  Positive for shortness of breath. Negative for wheezing.   Cardiovascular:  Negative for chest pain and leg swelling.  Gastrointestinal:  Negative for abdominal pain, diarrhea and vomiting.  Genitourinary:  Negative for difficulty urinating and urgency.  Musculoskeletal:  Positive  for neck pain. Negative for arthralgias.  Skin:  Negative for rash.  Neurological:  Negative for seizures.  Psychiatric/Behavioral:  Negative for hallucinations and suicidal ideas.   All other systems reviewed and are negative.   Objective  PHYSICAL EXAM  INITIAL VS BP: 143/77 (04/27/23 2315), Pulse: 99 (04/27/23 2315), Resp: 18 (04/27/23 2315), Temp: 98.1 F (04/27/23 2315), Temp src: Oral  (04/27/23 2315), SpO2: 97 % (04/27/23 2315), Height: 5' 10 (177.8 cm) (04/27/23 2315), Weight: 113.2 kg (249 lb 8 oz) (04/27/23 2315), BMI (Calculated): 35.8 (04/27/23 2315) No LMP for male patient.  Physical Exam Vitals and nursing note reviewed.  Constitutional:      Appearance: He is well-developed.  HENT:     Head: Normocephalic and atraumatic.     Mouth/Throat:     Comments: No vocal changes. Eyes:     Conjunctiva/sclera: Conjunctivae normal.     Pupils: Pupils are equal, round, and reactive to light.  Neck:     Trachea: No tracheal deviation.     Comments: There is fullness to the right anterior sternocleidomastoid muscle. No obvious mass or deformity. No meningismus.  Cardiovascular:     Rate and Rhythm: Normal rate and regular rhythm.  Pulmonary:     Effort: Pulmonary effort is normal.     Breath sounds: Normal breath sounds.  Abdominal:     General: Bowel sounds are normal. There is no distension.     Palpations: Abdomen is soft.     Tenderness: There is no abdominal tenderness.  Musculoskeletal:        General: No deformity. Normal range of motion.     Cervical back: Normal range of motion and neck supple.     Comments: No supraclavicular nodes palpated.   Skin:    General: Skin is warm and dry.     Capillary Refill: Capillary refill takes less than 2 seconds.  Neurological:     General: No focal deficit present.     Mental Status: He is alert and oriented to person, place, and time.     Cranial Nerves: No cranial nerve deficit.     MEDICAL DECISION MAKING AND PLAN OF CARE  Medical Decision Making This patient has been hemodynamically clinically stable throughout his stay here.  History of leukemia with new onset right jaw and anterior neck pain.  With some mild inflammatory change in the anterior neck around the sternocleidomastoid but no obvious masses or lymphadenopathy.  Laboratory studies did not show any acute or clinically significant abnormalities.  CT  imaging of the soft tissues of the neck did not show any acute or clinically significant abnormalities.  Chest x-ray is clear.  On reassessment the patient is resting comfortably denies shortness of breath says that anti-inflammatories have significantly helped his symptoms as well.  I will place him on a course of naproxen we will treat him for muscle strain/spasm and have him follow-up with his PCP.  Amount and/or Complexity of Data Reviewed External Data Reviewed: notes. Labs: ordered. Decision-making details documented in ED Course. Radiology: ordered and independent interpretation performed. Decision-making details documented in ED Course.    DIAGNOSTICS      LAB Labs Reviewed  CBC WITH AUTO DIFFERENTIAL - Abnormal      Result Value   WBC 10.35     RBC 4.47     Hemoglobin 13.7     Hematocrit 39.3     MCV 87.9     MCH 30.6     MCHC 34.9  RDW SD 38.7     RDW 11.9 (*)    Platelets 294     MPV 9.7     Neutrophils % Auto 64     Lymphocytes % Auto 26     Monocytes % Auto 9     Eosinophils % Auto 1     Basophils % Auto 0     Immature Grans % 0.2     Neutrophils Absolute Auto 6.59     Lymphocytes Absolute Auto 2.71 (*)    Monocytes Absolute Auto 0.93 (*)    Eosinophils Absolute Auto 0.08     Basophils Absolute Auto 0.02     Absolute Immature Grans 0.02    COMPREHENSIVE METABOLIC PANEL - Normal   Sodium 143     Potassium 4.1     Chloride 102     CO2 28     BUN 10     Glucose 93     Creatinine 0.9     BUN/Creatinine Ratio 11.1     Anion Gap 13     Osmolality, Calculated 284     Total Protein 7.1     Albumin 4.2     Globulin (Calc) 2.9     Albumin/Globulin Ratio 1.4     Bilirubin Total 0.3     Alkaline Phosphatase 91     AST 25     ALT 29     Calcium 8.9     eGFR >90.0    CBC WITH DIFFERENTIAL   Narrative:    The following orders were created for panel order CBC with Differential.                 Procedure                               Abnormality          Status                                    ---------                               -----------         ------                                    CBC with Auto Differential[[id number]]   Abnormal            Final result                                               Please view results for these tests on the individual orders.    RADIOLOGY CT SOFT TISSUE NECK W CONTRAST  Radiologist Impression      No CT findings to account for the patient's acute symptoms.    This document was created using medical transcription software.  An effort has been made to ensure  the accuracy of the transcription.  Any obvious errors or omissions should be clarified with the  author of this document.        XR CHEST PA OR AP  Reason for Exam: Difficulty breathing  Radiologist Impression      No radiographic evidence of an acute cardiac or pulmonary abnormality within limitations of single  view imaging.    This document was created using medical transcription software.  An effort has been made to ensure  the accuracy of the transcription.  Any obvious errors or omissions should be clarified with the  author of this document.           PROCEDURES  Procedures    REEVALUATION / CASE DISCUSSED      DISCHARGE:  There are no new complaints, changes, or physical findings at this time. The patient was discharged home with instructions regarding supportive care, medications, and appropriate follow up. I discussed indications for seeking immediate medical attention, and the patient voiced understanding. The patient was discharged home in stable condition after all questions were answered.  MEDICATIONS    Medications  ketorolac (TORADOL) injection 15 mg (15 mg IV Given 04/27/23 2343)  lactated ringers bolus solution 500 mL (500 mL IV Gravity infusion 04/27/23 2343)  Iohexol  (OMNIPAQUE ) 350 mg iodine/mL IV/oral solution 100 mL (100 mL IV Given 04/28/23 0011)    .  New Prescriptions for this Encounter    NAPROXEN (NAPROSYN) 500 MG TABLET    Take 1 Tablet (500 mg) by mouth Every 12 hours as needed for Pain, Moderate.    LAST VITAL SIGNS Vitals:   04/27/23 2315  BP: 143/77  BP Location: right arm  Patient Position: Sitting  Pulse: 99  Resp: 18  Temp: 98.1 F  TempSrc: Oral  SpO2: 97%  Weight: 113.2 kg (249 lb 8 oz)  Height: 5' 10 (1.778 m)    CLINICAL IMPRESSION  Final diagnoses:  [S16.1XXA] Acute strain of neck muscle, initial encounter (Primary)    DISPOSITION, EDUCATION AND MEDICATION RECONCILIATION  Medications reconciled.  See after visit summary for patient education on discharged patients.  I, Dr. Lawerance, personally performed the services described in this documentation, as scribed by Johnathon Ciprich in my presence.  The documentation is both accurate and complete.  I, Johnathon Ciprich, am scribing for Dr. Lawerance in his/her presence.  Please note that portions of this document were created using Dragon Medical transcription software.  Effort has been made to ensure the accuracy of the transcription. Any obvious errors or omissions should be clarified with the author of this document.

## 2023-04-27 NOTE — ED Notes (Signed)
 Pt states he has had pain in R neck and jaw since yesterday. Pt also states he has had a few episodes of SOB d/t swelling on neck. Has hx of leukemia and wanted to make sure it wasn't something related to that. Denies fever, chills, n/v, or difficulty swallowing.

## 2023-04-28 NOTE — ED Notes (Signed)
 Pt has steady gait to lobby. Pt is a&ox4. GCS 15. No altered mental status or SOB. DC instructions and prescriptions reviewed. Pt verbalized understanding of instructions. No questions. No new complaints.

## 2023-08-08 IMAGING — CR DG CHEST 2V
2 series · 2 of 2 positions shown · non-contrast
Comparison: 05/08/2020

CLINICAL DATA: Chest pressure.

EXAM:
CHEST - 2 VIEW

[chest pa]
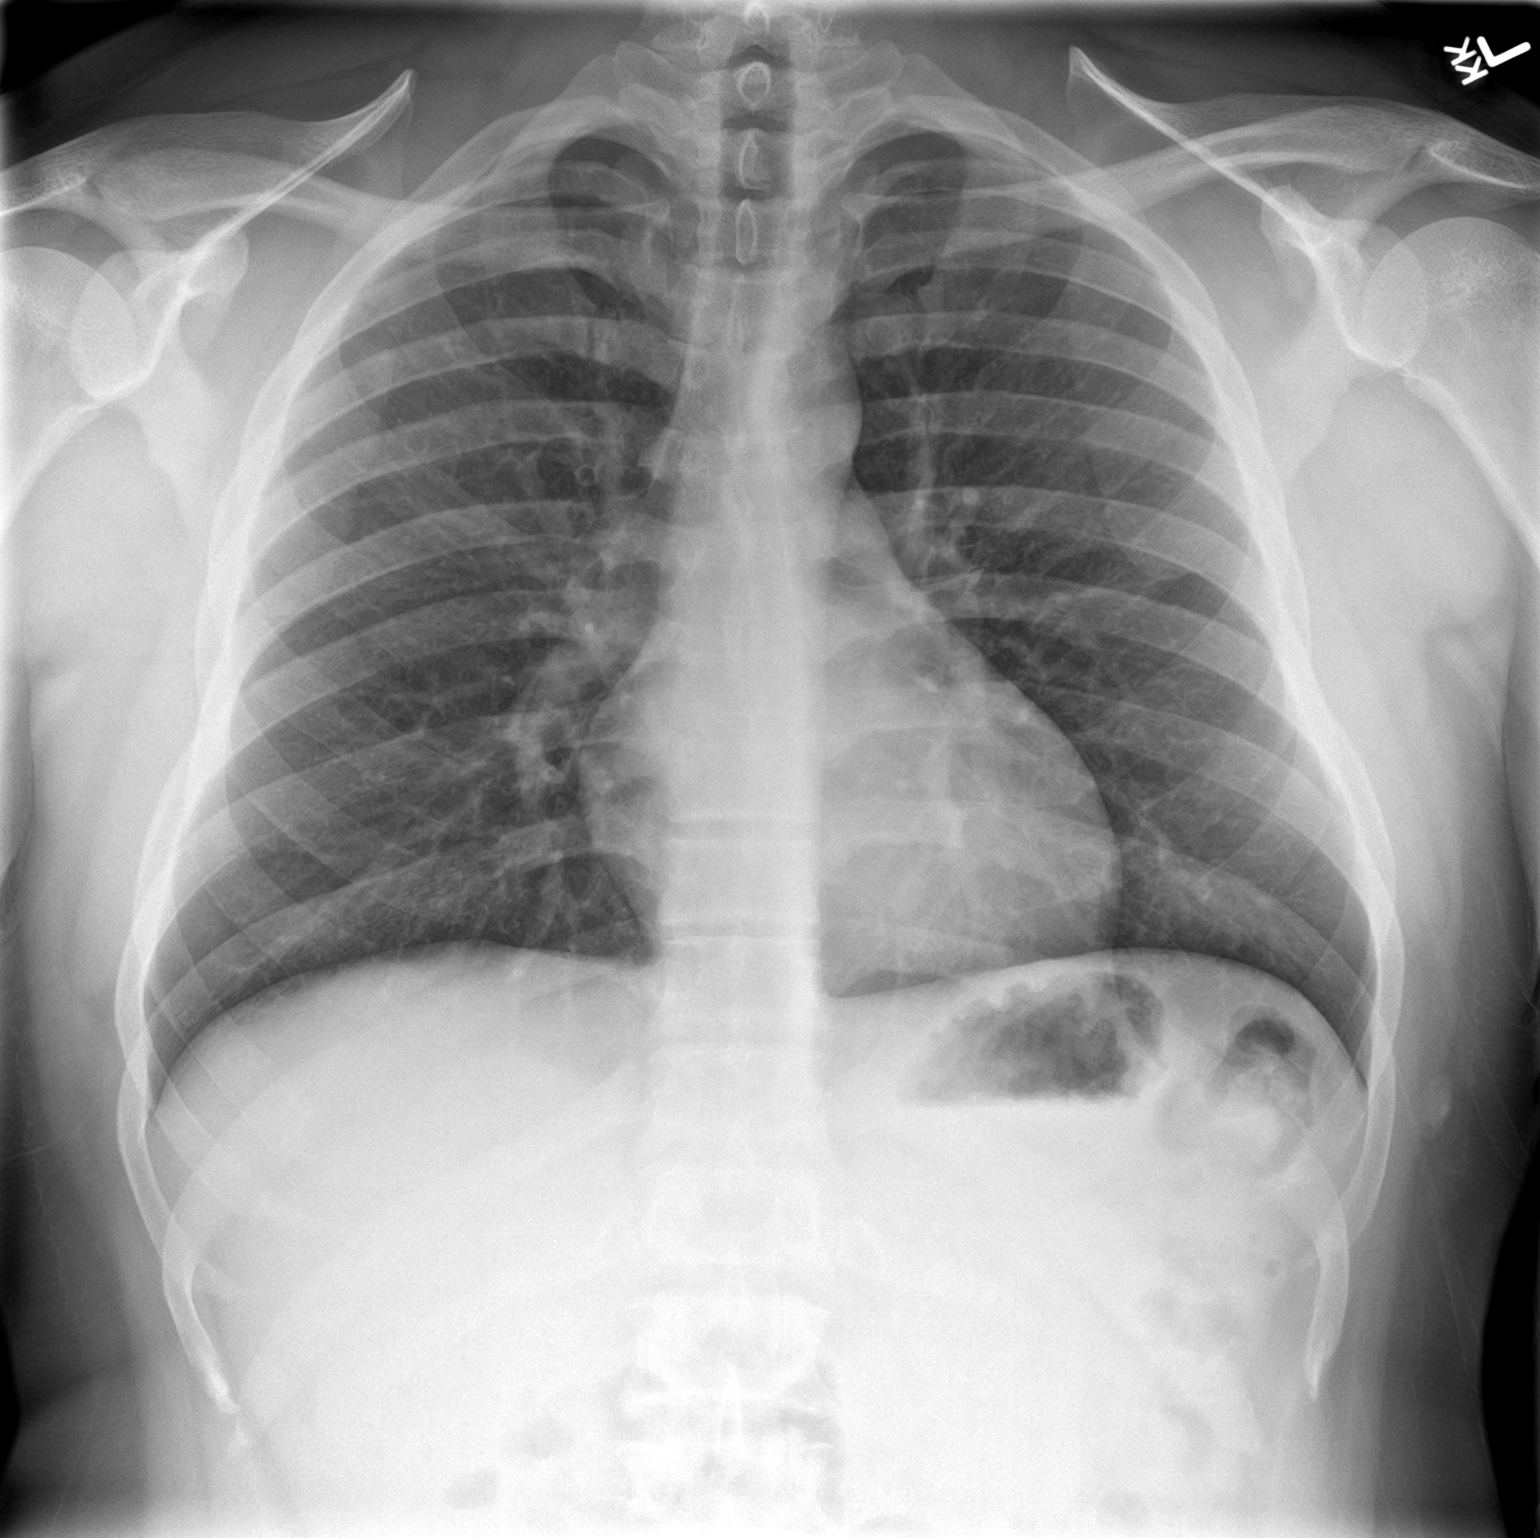

[chest lat]
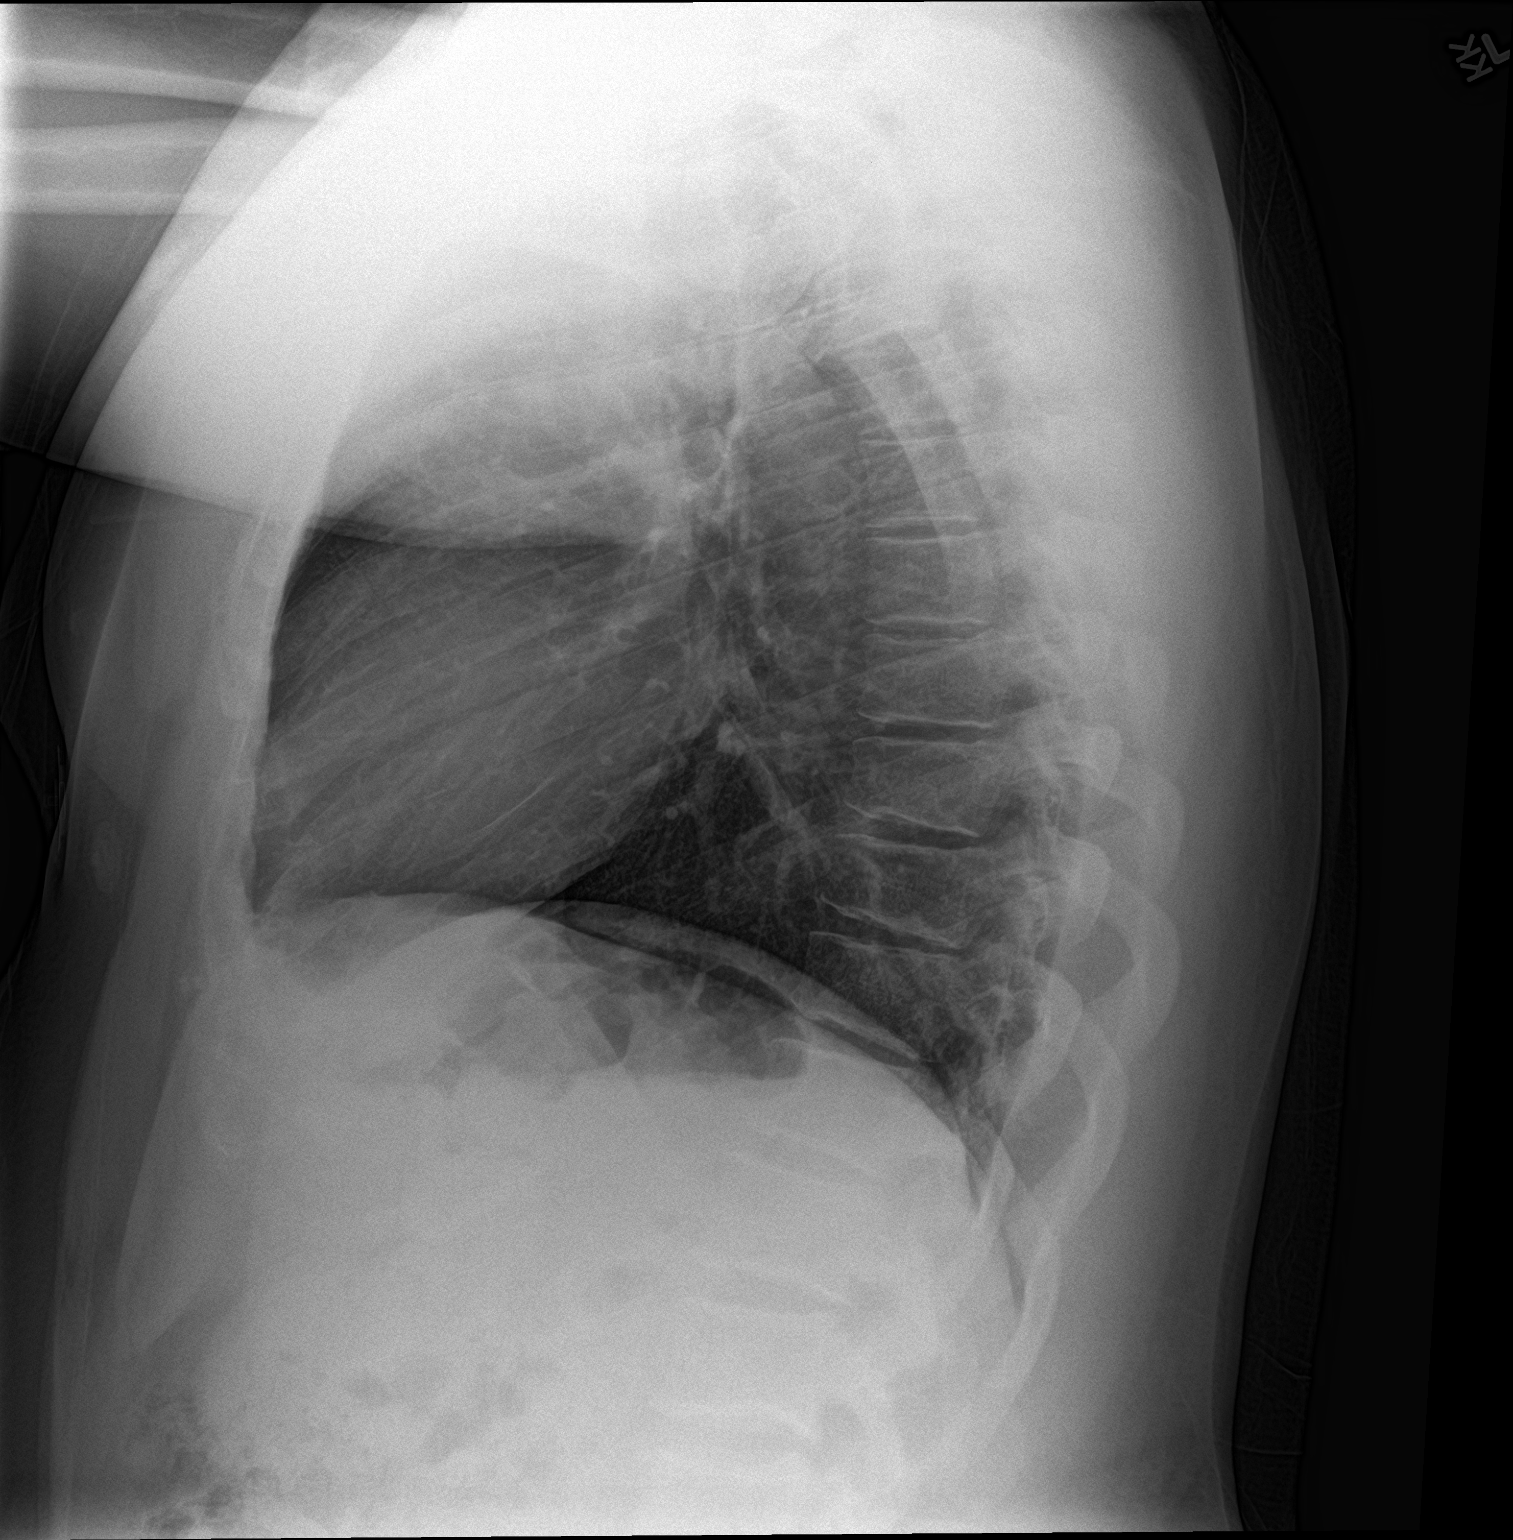

[2 of 2 positions shown; findings below may reference images not displayed]

FINDINGS: The lungs are clear without focal pneumonia, edema, pneumothorax or
pleural effusion. The cardiopericardial silhouette is within normal
limits for size. The visualized bony structures of the thorax are
unremarkable.
IMPRESSION: No active cardiopulmonary disease.

## 2024-03-01 ENCOUNTER — Encounter: Payer: Self-pay | Admitting: Emergency Medicine

## 2024-03-01 ENCOUNTER — Emergency Department
Admission: EM | Admit: 2024-03-01 | Discharge: 2024-03-01 | Disposition: A | Discharge status: Home / Self Care | Attending: Emergency Medicine | Admitting: Emergency Medicine

## 2024-03-01 ENCOUNTER — Other Ambulatory Visit: Payer: Self-pay

## 2024-03-01 ENCOUNTER — Emergency Department

## 2024-03-01 DIAGNOSIS — R0789 Other chest pain: Secondary | ICD-10-CM | POA: Insufficient documentation

## 2024-03-01 LAB — CBC
HCT: 39.6 % (ref 39.0–52.0)
Hemoglobin: 13.4 g/dL (ref 13.0–17.0)
MCH: 30.3 pg (ref 26.0–34.0)
MCHC: 33.8 g/dL (ref 30.0–36.0)
MCV: 89.6 fL (ref 80.0–100.0)
Platelets: 306 K/uL (ref 150–400)
RBC: 4.42 MIL/uL (ref 4.22–5.81)
RDW: 12.5 % (ref 11.5–15.5)
WBC: 9 K/uL (ref 4.0–10.5)
nRBC: 0 % (ref 0.0–0.2)

## 2024-03-01 LAB — COMPREHENSIVE METABOLIC PANEL WITH GFR
ALT: 27 U/L (ref 0–44)
AST: 23 U/L (ref 15–41)
Albumin: 3.7 g/dL (ref 3.5–5.0)
Alkaline Phosphatase: 61 U/L (ref 38–126)
Anion gap: 8 (ref 5–15)
BUN: 11 mg/dL (ref 6–20)
CO2: 27 mmol/L (ref 22–32)
Calcium: 9.1 mg/dL (ref 8.9–10.3)
Chloride: 104 mmol/L (ref 98–111)
Creatinine, Ser: 0.87 mg/dL (ref 0.61–1.24)
GFR, Estimated: >60 mL/min (ref >60)
Glucose, Bld: 111 mg/dL — ABNORMAL HIGH (ref 70–99)
Potassium: 3.8 mmol/L (ref 3.5–5.1)
Sodium: 139 mmol/L (ref 135–145)
Total Bilirubin: 0.8 mg/dL (ref 0.0–1.2)
Total Protein: 6.5 g/dL (ref 6.5–8.1)

## 2024-03-01 LAB — TROPONIN I (HIGH SENSITIVITY)
Troponin I (High Sensitivity): 5 ng/L (ref <18)
Troponin I (High Sensitivity): 4 ng/L (ref <18)

## 2024-03-01 LAB — D-DIMER, QUANTITATIVE: D-Dimer, Quant: <0.27 ug{FEU}/mL (ref 0.00–0.50)

## 2024-03-01 NOTE — Discharge Instructions (Signed)
 Follow-up with your primary care doctor for further evaluation.  In the meantime please take Tylenol  for pain.  Follow-up here for new worse or different symptoms.

## 2024-03-01 NOTE — ED Triage Notes (Signed)
 Patient ambulatory to triage with steady gait, without difficulty or distress noted; pt reports left sided CP, nonradiating with no accomp symptoms x hr; st hx of same with dx anxiety

## 2024-03-01 NOTE — ED Notes (Signed)
 ED Provider at bedside.

## 2024-03-01 NOTE — ED Provider Notes (Signed)
 [place] [facility] AT [facility] Provider Note   CSN: [id number] Arrival date & time: 03/01/24  9371     Patient presents with: Chest Pain   Ian Wilkerson is a 33 y.o. male.   Patient with a history of CML here for chest pain.  Noting a chest tightness that started earlier this morning about 3 hours ago.  He works third shift as a IT trainer and started developing symptoms after coming home from work.  Pain has not fully resolved.  No history of asthma not current smoker.  No cough congestion flulike symptoms or recent exposures.   Chest Pain Associated symptoms: no abdominal pain, no cough, no fever, no nausea, no numbness, no shortness of breath, no vomiting and no weakness        Prior to Admission medications   Not on File    Allergies: Compazine  [prochlorperazine ]    Review of Systems  Constitutional:  Negative for chills and fever.  HENT:  Negative for congestion.   Respiratory:  Negative for cough and shortness of breath.   Cardiovascular:  Positive for chest pain.  Gastrointestinal:  Negative for abdominal pain, diarrhea, nausea and vomiting.  Genitourinary:  Negative for dysuria.  Neurological:  Negative for weakness and numbness.    Updated Vital Signs BP 131/75 (BP Location: Left Arm)   Pulse 78   Temp 98.7 F (37.1 C) (Oral)   Resp 17   Ht 5' 10 (1.778 m)   Wt 117.9 kg   SpO2 98%   BMI 37.31 kg/m   Physical Exam Vitals reviewed.  Constitutional:      Appearance: Normal appearance.  HENT:     Head: Normocephalic and atraumatic.     Nose: Nose normal.  Cardiovascular:     Pulses: Normal pulses.  Pulmonary:     Effort: Pulmonary effort is normal. No tachypnea or respiratory distress.     Breath sounds: No decreased breath sounds, wheezing or rhonchi.  Musculoskeletal:     Cervical back: Normal range of motion.  Neurological:     Mental Status: He is alert and oriented to person, place, and time. Mental status is at  baseline.  Psychiatric:        Mood and Affect: Mood normal.        Behavior: Behavior normal.     (all labs ordered are listed, but only abnormal results are displayed) Labs Reviewed  COMPREHENSIVE METABOLIC PANEL WITH GFR - Abnormal; Notable for the following components:      Result Value   Glucose, Bld 111 (*)    All other components within normal limits  CBC  D-DIMER, QUANTITATIVE  TROPONIN I (HIGH SENSITIVITY)  TROPONIN I (HIGH SENSITIVITY)    EKG: EKG Interpretation Date/Time:  Thursday March 01 2024 06:34:15 EDT Ventricular Rate:  75 PR Interval:  154 QRS Duration:  96 QT Interval:  392 QTC Calculation: 437 R Axis:   71  Text Interpretation: Normal sinus rhythm Nonspecific T wave abnormality Abnormal ECG When compared with ECG of 27-Sep-2021 09:17, No significant change was found Confirmed by UNCONFIRMED, DOCTOR ([id number]), editor Ian Wilkerson [phone]) on 03/01/2024 9:06:05 AM  Radiology: DG Chest 2 View Result Date: 03/01/2024 EXAM: 2 VIEW(S) XRAY OF THE CHEST 03/01/2024 06:49:00 AM COMPARISON: 09/27/2021 CLINICAL HISTORY: CP. pt reports left sided CP, nonradiating with no accomp symptoms x hr FINDINGS: LUNGS AND PLEURA: No focal pulmonary opacity. No pulmonary edema. No pleural effusion. No pneumothorax. HEART AND MEDIASTINUM: No acute abnormality of the cardiac  and mediastinal silhouettes. BONES AND SOFT TISSUES: No acute osseous abnormality. IMPRESSION: 1. No acute process. Electronically signed by: Waddell Calk MD 03/01/2024 07:04 AM EDT RP Workstation: [id number]     Procedures   Medications Ordered in the ED - No data to display                                  Medical Decision Making Patient with a history of CML here for chest tightness.  Symptoms started about 3 hours prior to arrival.  No other flulike symptoms.  He is hemodynamically stable nontachycardic nonhypoxic and overall well-appearing.  I have a relatively low suspicion for pulmonary embolus based  on exam and history however given his cancer history will evaluate with a D-dimer.  D-dimer is negative.  Rule out for acute pulmonary embolus.  Troponins negative.  Ruled out for acute coronary syndrome.  Patient still with minimal and atypical symptoms.  Will recommend follow-up with primary care for further evaluation.  Given return precautions for new worse or different symptoms.  Amount and/or Complexity of Data Reviewed Labs: ordered.        Final diagnoses:  Atypical chest pain    ED Discharge Orders     None          Ian Wilkerson, [place] 03/01/24 [id number]    Ian Ronal BRAVO, MD 03/02/24 1328

## 2024-03-01 NOTE — ED Notes (Signed)
 See triage note  Presents with chest pain  states pain started about 1 hr PTA  States pain is non radiating  Denies any fever,n/v/ or cough  Also denies any SOB Resp even and non labored
